# Patient Record
Sex: Female | Born: 1995 | Race: White | Hispanic: No | Marital: Married | Smoking: Never smoker
Health system: Southern US, Community
[De-identification: ages and names within clinical notes are randomized; demographics above are authoritative.]

## PROBLEM LIST (undated history)

## (undated) ENCOUNTER — Inpatient Hospital Stay (HOSPITAL_COMMUNITY): Payer: Self-pay

## (undated) DIAGNOSIS — R21 Rash and other nonspecific skin eruption: Secondary | ICD-10-CM

## (undated) DIAGNOSIS — Z789 Other specified health status: Secondary | ICD-10-CM

## (undated) DIAGNOSIS — M25519 Pain in unspecified shoulder: Secondary | ICD-10-CM

## (undated) HISTORY — PX: WISDOM TOOTH EXTRACTION: SHX21

## (undated) HISTORY — DX: Rash and other nonspecific skin eruption: R21

## (undated) HISTORY — DX: Pain in unspecified shoulder: M25.519

## (undated) HISTORY — PX: NO PAST SURGERIES: SHX2092

## (undated) HISTORY — PX: OTHER SURGICAL HISTORY: SHX169

---
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
## 2015-03-22 NOTE — L&D Delivery Note (Signed)
Delivery Note At  a viable and healthy female was delivered via  (Presentation:left occiput ; anterior ).  APGAR: 8 ,9 ; weight 3200 grm, 7#1  .   Placenta status: Spontaneous, intact.  Cord: 3V with loose nuchal x 1 reduced on the perineum  Pt had some atony after delivery. This resolved with bimanual massage and IM methergine. Additionally, there was a pumping vessel at the hymenal ring tear  Anesthesia:  None, 1% lidocaine with fentanyl for repair Episiotomy:   None Lacerations:  hymenal ring tear Suture Repair: 3.0 vicryl Est. Blood Loss (mL):  500cc  Mom to postpartum.  Baby to Couplet care / Skin to Skin.  Madeline Martin. 10/12/2015, 3:03 AM

## 2015-09-23 LAB — OB RESULTS CONSOLE GBS: GBS: NEGATIVE

## 2015-10-11 ENCOUNTER — Encounter (HOSPITAL_COMMUNITY): Payer: Self-pay | Admitting: *Deleted

## 2015-10-11 ENCOUNTER — Inpatient Hospital Stay (HOSPITAL_COMMUNITY)
Admission: AD | Admit: 2015-10-11 | Discharge: 2015-10-13 | DRG: 775 | Disposition: A | Discharge status: Home / Self Care | Payer: BLUE CROSS/BLUE SHIELD | Source: Ambulatory Visit | Attending: Obstetrics and Gynecology | Admitting: Obstetrics and Gynecology

## 2015-10-11 DIAGNOSIS — IMO0001 Reserved for inherently not codable concepts without codable children: Secondary | ICD-10-CM

## 2015-10-11 DIAGNOSIS — Z9889 Other specified postprocedural states: Secondary | ICD-10-CM

## 2015-10-11 DIAGNOSIS — Z3A38 38 weeks gestation of pregnancy: Secondary | ICD-10-CM | POA: Diagnosis not present

## 2015-10-11 HISTORY — DX: Other specified health status: Z78.9

## 2015-10-11 LAB — CBC
HEMATOCRIT: 38.7 % (ref 36.0–46.0)
Hemoglobin: 13.7 g/dL (ref 12.0–15.0)
MCH: 30.6 pg (ref 26.0–34.0)
MCHC: 35.4 g/dL (ref 30.0–36.0)
MCV: 86.4 fL (ref 78.0–100.0)
PLATELETS: 211 10*3/uL (ref 150–400)
RBC: 4.48 MIL/uL (ref 3.87–5.11)
RDW: 12.7 % (ref 11.5–15.5)
WBC: 13.6 10*3/uL — ABNORMAL HIGH (ref 4.0–10.5)

## 2015-10-11 LAB — TYPE AND SCREEN
ABO/RH(D): A POS
ANTIBODY SCREEN: NEGATIVE

## 2015-10-11 LAB — ABO/RH: ABO/RH(D): A POS

## 2015-10-11 LAB — POCT FERN TEST: POCT Fern Test: POSITIVE

## 2015-10-11 MED ORDER — OXYCODONE-ACETAMINOPHEN 5-325 MG PO TABS: 2.0000 | ORAL_TABLET | ORAL | Status: DC | PRN

## 2015-10-11 MED ORDER — OXYTOCIN 40 UNITS IN LACTATED RINGERS INFUSION - SIMPLE MED
1.0000 m[IU]/min | INTRAVENOUS | Status: DC
  Administered 2015-10-11: 2 m[IU]/min via INTRAVENOUS

## 2015-10-11 MED ORDER — LIDOCAINE HCL (PF) 1 % IJ SOLN
30.0000 mL | INTRAMUSCULAR | Status: DC | PRN
  Administered 2015-10-12: 30 mL via SUBCUTANEOUS
  Filled 2015-10-11: qty 30

## 2015-10-11 MED ORDER — FLEET ENEMA 7-19 GM/118ML RE ENEM: 1.0000 | ENEMA | RECTAL | Status: DC | PRN

## 2015-10-11 MED ORDER — BUTORPHANOL TARTRATE 1 MG/ML IJ SOLN
1.0000 mg | INTRAMUSCULAR | Status: DC | PRN
  Administered 2015-10-12: 1 mg via INTRAVENOUS
  Filled 2015-10-11: qty 1

## 2015-10-11 MED ORDER — OXYTOCIN 40 UNITS IN LACTATED RINGERS INFUSION - SIMPLE MED
2.5000 [IU]/h | INTRAVENOUS | Status: DC
  Filled 2015-10-11: qty 1000

## 2015-10-11 MED ORDER — SOD CITRATE-CITRIC ACID 500-334 MG/5ML PO SOLN: 30.0000 mL | ORAL | Status: DC | PRN

## 2015-10-11 MED ORDER — TERBUTALINE SULFATE 1 MG/ML IJ SOLN
0.2500 mg | Freq: Once | INTRAMUSCULAR | Status: DC | PRN
  Filled 2015-10-11: qty 1

## 2015-10-11 MED ORDER — LACTATED RINGERS IV SOLN: 500.0000 mL | INTRAVENOUS | Status: DC | PRN

## 2015-10-11 MED ORDER — ACETAMINOPHEN 325 MG PO TABS: 650.0000 mg | ORAL_TABLET | ORAL | Status: DC | PRN

## 2015-10-11 MED ORDER — OXYCODONE-ACETAMINOPHEN 5-325 MG PO TABS: 1.0000 | ORAL_TABLET | ORAL | Status: DC | PRN

## 2015-10-11 MED ORDER — LACTATED RINGERS IV SOLN
INTRAVENOUS | Status: DC
  Administered 2015-10-11 (×2): via INTRAVENOUS

## 2015-10-11 MED ORDER — OXYTOCIN BOLUS FROM INFUSION: 500.0000 mL | Freq: Once | INTRAVENOUS | Status: DC

## 2015-10-11 MED ORDER — ONDANSETRON HCL 4 MG/2ML IJ SOLN: 4.0000 mg | Freq: Four times a day (QID) | INTRAMUSCULAR | Status: DC | PRN

## 2015-10-11 NOTE — H&P (Signed)
Madeline Martin is a 20 y.o. female presenting for leaking fluid  20 yo G1P0 @ 38+5 presented for leaking fluid and was confirmed to have broken her water. OB History    Gravida Para Term Preterm AB Living   1             SAB TAB Ectopic Multiple Live Births                 Past Medical History:  Diagnosis Date  . Medical history non-contributory    Past Surgical History:  Procedure Laterality Date  . NO PAST SURGERIES    . WISDOM TOOTH EXTRACTION     Family History: family history is not on file. Social History:  reports that she has never smoked. She has never used smokeless tobacco. She reports that she does not drink alcohol or use drugs.     Maternal Diabetes: No Genetic Screening: Normal Maternal Ultrasounds/Referrals: Normal Fetal Ultrasounds or other Referrals:  None Maternal Substance Abuse:  No Significant Maternal Medications:  None Significant Maternal Lab Results:  None Other Comments:  None  ROS History Dilation: 2.5 Effacement (%): 90 Station: -1 Exam by:: Foye Clock RN Blood pressure 121/69, pulse 97, temperature 97.8 F (36.6 C), temperature source Oral, resp. rate 18, height 5\' 5"  (1.651 m), weight 104.3 kg (230 lb), last menstrual period 01/13/2015. Exam Physical Exam  Prenatal labs: ABO, Rh: --/--/A POS (07/23 1220) Antibody: NEG (07/23 1022) Rubella:  Immune RPR:   NR HBsAg:   Neg HIV:   NR GBS: Negative (07/05 0000)   Assessment/Plan: 1) Admit 2) Epidural on request 3) Pitocin augmentation as needed   Madeline Martin H. 10/11/2015, 5:05 PM

## 2015-10-11 NOTE — MAU Note (Signed)
Pt states she woke up this morning at 0600 and felt some fluid come out.  She then got up and has been wet multiple times.

## 2015-10-11 NOTE — MAU Provider Note (Signed)
Madeline Martin 20 y.o. [redacted]w[redacted]d Gives history of repeated leaking of small amounts of fluid. Sterile speculum exam done. Cervical mucous noted.  Small amount of fluid is pooling vs usual vaginal discharge. Fern slide made.  RN to read. Cervix 2 cm, 90%, feel collection of fluid in front of baby's head.

## 2015-10-11 NOTE — Anesthesia Pain Management Evaluation Note (Signed)
  CRNA Pain Management Visit Note  Patient: Madeline Martin, 20 y.o., female  "Hello I am a member of the anesthesia team at [facility]. We have an anesthesia team available at all times to provide care throughout the hospital, including epidural management and anesthesia for C-section. I don't know your plan for the delivery whether it a natural birth, water birth, IV sedation, nitrous supplementation, doula or epidural, but we want to meet your pain goals."   1.Was your pain managed to your expectations on prior hospitalizations?   No prior hospitalizations  2.What is your expectation for pain management during this hospitalization?     Nitrous Oxide  3.How can we help you reach that goal? unsure  Record the patient's initial score and the patient's pain goal.   Pain: 2  Pain Goal: 9 [facility] wants you to be able to say your pain was always managed very well.  Cephus Shelling 10/11/2015

## 2015-10-12 ENCOUNTER — Encounter (HOSPITAL_COMMUNITY): Payer: Self-pay | Admitting: *Deleted

## 2015-10-12 LAB — CBC
HEMATOCRIT: 34.3 % — AB (ref 36.0–46.0)
HEMOGLOBIN: 12.1 g/dL (ref 12.0–15.0)
MCH: 30.3 pg (ref 26.0–34.0)
MCHC: 35.3 g/dL (ref 30.0–36.0)
MCV: 85.8 fL (ref 78.0–100.0)
Platelets: 193 10*3/uL (ref 150–400)
RBC: 4 MIL/uL (ref 3.87–5.11)
RDW: 12.7 % (ref 11.5–15.5)
WBC: 19.4 10*3/uL — AB (ref 4.0–10.5)

## 2015-10-12 LAB — RPR: RPR: NONREACTIVE

## 2015-10-12 MED ORDER — FENTANYL 2.5 MCG/ML BUPIVACAINE 1/10 % EPIDURAL INFUSION (WH - ANES): 14.0000 mL/h | INTRAMUSCULAR | Status: DC | PRN

## 2015-10-12 MED ORDER — METHYLERGONOVINE MALEATE 0.2 MG/ML IJ SOLN
0.2000 mg | Freq: Once | INTRAMUSCULAR | Status: AC
Start: 2015-10-12 — End: 2015-10-12
  Administered 2015-10-12: 0.2 mg via INTRAMUSCULAR

## 2015-10-12 MED ORDER — SIMETHICONE 80 MG PO CHEW: 80.0000 mg | CHEWABLE_TABLET | ORAL | Status: DC | PRN

## 2015-10-12 MED ORDER — PHENYLEPHRINE 40 MCG/ML (10ML) SYRINGE FOR IV PUSH (FOR BLOOD PRESSURE SUPPORT)
80.0000 ug | PREFILLED_SYRINGE | INTRAVENOUS | Status: DC | PRN
  Filled 2015-10-12: qty 5

## 2015-10-12 MED ORDER — EPHEDRINE 5 MG/ML INJ
10.0000 mg | INTRAVENOUS | Status: DC | PRN
  Filled 2015-10-12: qty 4

## 2015-10-12 MED ORDER — PRENATAL MULTIVITAMIN CH
1.0000 | ORAL_TABLET | Freq: Every day | ORAL | Status: DC
  Administered 2015-10-12 – 2015-10-13 (×2): 1 via ORAL
  Filled 2015-10-12 (×2): qty 1

## 2015-10-12 MED ORDER — DIBUCAINE 1 % RE OINT: 1.0000 "application " | TOPICAL_OINTMENT | RECTAL | Status: DC | PRN

## 2015-10-12 MED ORDER — LACTATED RINGERS IV SOLN: 500.0000 mL | Freq: Once | INTRAVENOUS | Status: DC

## 2015-10-12 MED ORDER — OXYCODONE HCL 5 MG PO TABS: 10.0000 mg | ORAL_TABLET | ORAL | Status: DC | PRN

## 2015-10-12 MED ORDER — METHYLERGONOVINE MALEATE 0.2 MG PO TABS: 0.2000 mg | ORAL_TABLET | ORAL | Status: DC | PRN

## 2015-10-12 MED ORDER — DIPHENHYDRAMINE HCL 50 MG/ML IJ SOLN: 12.5000 mg | INTRAMUSCULAR | Status: DC | PRN

## 2015-10-12 MED ORDER — SENNOSIDES-DOCUSATE SODIUM 8.6-50 MG PO TABS
2.0000 | ORAL_TABLET | ORAL | Status: DC
  Administered 2015-10-13: 2 via ORAL
  Filled 2015-10-12: qty 2

## 2015-10-12 MED ORDER — DIPHENHYDRAMINE HCL 25 MG PO CAPS: 25.0000 mg | ORAL_CAPSULE | Freq: Four times a day (QID) | ORAL | Status: DC | PRN

## 2015-10-12 MED ORDER — TETANUS-DIPHTH-ACELL PERTUSSIS 5-2.5-18.5 LF-MCG/0.5 IM SUSP: 0.5000 mL | Freq: Once | INTRAMUSCULAR | Status: DC

## 2015-10-12 MED ORDER — ZOLPIDEM TARTRATE 5 MG PO TABS: 5.0000 mg | ORAL_TABLET | Freq: Every evening | ORAL | Status: DC | PRN

## 2015-10-12 MED ORDER — NALOXONE HCL 0.4 MG/ML IJ SOLN
INTRAMUSCULAR | Status: AC
  Filled 2015-10-12: qty 1

## 2015-10-12 MED ORDER — FENTANYL CITRATE (PF) 100 MCG/2ML IJ SOLN
INTRAMUSCULAR | Status: AC
  Filled 2015-10-12: qty 2

## 2015-10-12 MED ORDER — COCONUT OIL OIL: 1.0000 "application " | TOPICAL_OIL | Status: DC | PRN

## 2015-10-12 MED ORDER — ACETAMINOPHEN 325 MG PO TABS: 650.0000 mg | ORAL_TABLET | ORAL | Status: DC | PRN

## 2015-10-12 MED ORDER — BENZOCAINE-MENTHOL 20-0.5 % EX AERO
1.0000 "application " | INHALATION_SPRAY | CUTANEOUS | Status: DC | PRN
  Filled 2015-10-12: qty 56

## 2015-10-12 MED ORDER — FENTANYL CITRATE (PF) 100 MCG/2ML IJ SOLN
100.0000 ug | Freq: Once | INTRAMUSCULAR | Status: AC
  Administered 2015-10-12: 100 ug via INTRAVENOUS

## 2015-10-12 MED ORDER — IBUPROFEN 600 MG PO TABS
600.0000 mg | ORAL_TABLET | Freq: Four times a day (QID) | ORAL | Status: DC
  Administered 2015-10-12 – 2015-10-13 (×5): 600 mg via ORAL
  Filled 2015-10-12 (×6): qty 1

## 2015-10-12 MED ORDER — ONDANSETRON HCL 4 MG/2ML IJ SOLN: 4.0000 mg | INTRAMUSCULAR | Status: DC | PRN

## 2015-10-12 MED ORDER — ONDANSETRON HCL 4 MG PO TABS: 4.0000 mg | ORAL_TABLET | ORAL | Status: DC | PRN

## 2015-10-12 MED ORDER — OXYCODONE HCL 5 MG PO TABS: 5.0000 mg | ORAL_TABLET | ORAL | Status: DC | PRN

## 2015-10-12 MED ORDER — METHYLERGONOVINE MALEATE 0.2 MG/ML IJ SOLN: 0.2000 mg | INTRAMUSCULAR | Status: DC | PRN

## 2015-10-12 MED ORDER — WITCH HAZEL-GLYCERIN EX PADS: 1.0000 "application " | MEDICATED_PAD | CUTANEOUS | Status: DC | PRN

## 2015-10-12 NOTE — Lactation Note (Signed)
This note was copied from a baby's chart. Lactation Consultation Note; Initial visit baby now 87 hours old. Baby asleep in visitors arms at present. Mom reports he last fed about 1/2 ago for 10 min on and off the breast. Mom eating lunch and wants to get in shower. Reviewed feeding cues and encouraged to feed whenever she sees them. Reviewed normal newborn behavior the first 24 hours. Mom reports he nursed for 30 min about 4 am and that was his best feeding. Encouraged to call RN or LC to observe latch. BF brochure given with resource sheet. Reviewed OP appointments and BFSG as resources for support after DC. To call for assist when baby feds next.  Patient Name: [name] Today's Date: 10/12/2015 Reason for consult: Initial assessment   Maternal Data Formula Feeding for Exclusion: No Has patient been taught Hand Expression?: Yes Does the patient have breastfeeding experience prior to this delivery?: No  Feeding Feeding Type:  (mother is encouraged to call for Br ass't) Length of feed: 10 min  LATCH Score/Interventions                      Lactation Tools Discussed/Used WIC Program: No   Consult Status Consult Status: Follow-up Date: 10/13/15 Follow-up type: In-patient    Pamelia Hoit 10/12/2015, 2:08 PM

## 2015-10-12 NOTE — Progress Notes (Signed)
Post Partum Day 0 Subjective: no complaints, up ad lib, voiding, tolerating PO, + flatus and breast feeding   Objective: Blood pressure 113/71, pulse 95, temperature 98.3 F (36.8 C), temperature source Oral, resp. rate 18, height 5\' 5"  (1.651 m), weight 104.3 kg (230 lb), last menstrual period 01/13/2015, SpO2 100 %, unknown if currently breastfeeding.  Physical Exam:  General: alert, cooperative and appears stated age Lochia: appropriate Uterine Fundus: firm DVT Evaluation: No evidence of DVT seen on physical exam. Negative Homan's sign. No cords or calf tenderness.   Recent Labs  10/11/15 1022 10/12/15 0519  HGB 13.7 12.1  HCT 38.7 34.3*    Assessment/Plan: Breastfeeding and Circumcision prior to discharge   LOS: 1 day   Madeline Martin Madeline Martin 10/12/2015, 11:06 AM

## 2015-10-13 ENCOUNTER — Ambulatory Visit: Payer: Self-pay

## 2015-10-13 NOTE — Progress Notes (Signed)
PPD#1 Pt has no complaints. Lochia wnl VSSAF IMP/ Doing well Plan/ Will discharge to home.

## 2015-10-13 NOTE — Lactation Note (Signed)
This note was copied from a baby's chart. Lactation Consultation Note  Patient Name: Madeline Martin [id number] Date: 10/13/2015 Reason for consult: Follow-up assessment Baby 40 hours old. Mom reports that baby has been cluster-feeding today. Mom states that baby was sleepy earlier today when he returned from his circumcision, but then woke up and wanted to nurse often. Mom states that he has just received a second dose of Tylenol, and is now sleepy again. Enc lots of STS and nursing with cues.   Mom reports that she will have follow-up appointment with an LC through her peds office this week. Mom aware of OP/BFSG and LC phone line assistance after D/C.   Maternal Data    Feeding Feeding Type: Breast Fed Length of feed: 10 min  LATCH Score/Interventions                      Lactation Tools Discussed/Used     Consult Status Consult Status: Follow-up Date: 10/14/15 Follow-up type: In-patient    Geralynn Ochs 10/13/2015, 6:47 PM

## 2015-10-13 NOTE — Discharge Summary (Signed)
Obstetric Discharge Summary Reason for Admission: rupture of membranes Prenatal Procedures: ultrasound Intrapartum Procedures: spontaneous vaginal delivery Postpartum Procedures: none Complications-Operative and Postpartum: none Hemoglobin  Date Value Ref Range Status  10/12/2015 12.1 12.0 - 15.0 g/dL Final   HCT  Date Value Ref Range Status  10/12/2015 34.3 (L) 36.0 - 46.0 % Final    Physical Exam:  General: alert Lochia: appropriate Uterine Fundus: firm   Discharge Diagnoses: Term Pregnancy-delivered  Discharge Information: Date: 10/13/2015 Activity: pelvic rest Diet: routine Medications: PNV and Ibuprofen Condition: stable Instructions: refer to practice specific booklet Discharge to: home Follow-up Information    Almon Hercules., MD. Schedule an appointment as soon as possible for a visit in 1 month(s).   Specialty:  Obstetrics and Gynecology Contact information: [address] [place] [address] [phone]           Newborn Data: Live born female  Birth Weight: 7 lb 0.9 oz (3200 g) APGAR: 7, 9  Home with mother.  ANDERSON,MARK E 10/13/2015, 9:55 AM

## 2015-10-14 ENCOUNTER — Encounter (HOSPITAL_COMMUNITY): Payer: Self-pay

## 2015-10-14 ENCOUNTER — Encounter: Payer: Self-pay | Admitting: Family Medicine

## 2015-10-14 ENCOUNTER — Ambulatory Visit: Payer: Self-pay

## 2015-10-14 NOTE — Lactation Note (Signed)
This note was copied from a baby's chart. Lactation Consultation Note: Mother states that infant is feeding well. Mother reports that she understands treatment to prevent engorgement. Mother advised to continue to cue base feed infant and at least 8-12 times in 24 hours. Mother has an appt with [facility] tomorrow through [facility] office.  Patient Name: Madeline Martin IDPOE'U Date: 10/14/2015 Reason for consult: Initial assessment;Follow-up assessment   Maternal Data    Feeding    [facility] Score/Interventions                      Lactation Tools Discussed/Used     Consult Status      Madeline Martin 10/14/2015, 10:19 AM

## 2017-09-22 ENCOUNTER — Inpatient Hospital Stay (HOSPITAL_COMMUNITY)
Admission: AD | Admit: 2017-09-22 | Discharge: 2017-09-22 | Disposition: A | Discharge status: Home / Self Care | Payer: 59 | Source: Ambulatory Visit | Attending: Obstetrics and Gynecology | Admitting: Obstetrics and Gynecology

## 2017-09-22 ENCOUNTER — Inpatient Hospital Stay (HOSPITAL_COMMUNITY): Payer: 59

## 2017-09-22 ENCOUNTER — Encounter (HOSPITAL_COMMUNITY): Payer: Self-pay

## 2017-09-22 DIAGNOSIS — Z79899 Other long term (current) drug therapy: Secondary | ICD-10-CM | POA: Insufficient documentation

## 2017-09-22 DIAGNOSIS — O418X10 Other specified disorders of amniotic fluid and membranes, first trimester, not applicable or unspecified: Secondary | ICD-10-CM

## 2017-09-22 DIAGNOSIS — Q513 Bicornate uterus: Secondary | ICD-10-CM | POA: Insufficient documentation

## 2017-09-22 DIAGNOSIS — O468X1 Other antepartum hemorrhage, first trimester: Secondary | ICD-10-CM

## 2017-09-22 DIAGNOSIS — Z3A01 Less than 8 weeks gestation of pregnancy: Secondary | ICD-10-CM | POA: Insufficient documentation

## 2017-09-22 DIAGNOSIS — O26891 Other specified pregnancy related conditions, first trimester: Secondary | ICD-10-CM | POA: Diagnosis not present

## 2017-09-22 DIAGNOSIS — R109 Unspecified abdominal pain: Secondary | ICD-10-CM | POA: Insufficient documentation

## 2017-09-22 DIAGNOSIS — O208 Other hemorrhage in early pregnancy: Secondary | ICD-10-CM | POA: Diagnosis not present

## 2017-09-22 DIAGNOSIS — O3401 Maternal care for unspecified congenital malformation of uterus, first trimester: Secondary | ICD-10-CM | POA: Diagnosis not present

## 2017-09-22 DIAGNOSIS — Z823 Family history of stroke: Secondary | ICD-10-CM | POA: Diagnosis not present

## 2017-09-22 DIAGNOSIS — O4690 Antepartum hemorrhage, unspecified, unspecified trimester: Secondary | ICD-10-CM

## 2017-09-22 DIAGNOSIS — Z3491 Encounter for supervision of normal pregnancy, unspecified, first trimester: Secondary | ICD-10-CM | POA: Diagnosis not present

## 2017-09-22 DIAGNOSIS — O4691 Antepartum hemorrhage, unspecified, first trimester: Secondary | ICD-10-CM | POA: Diagnosis present

## 2017-09-22 LAB — URINALYSIS, ROUTINE W REFLEX MICROSCOPIC
Bilirubin Urine: NEGATIVE
Glucose, UA: NEGATIVE mg/dL
KETONES UR: NEGATIVE mg/dL
Leukocytes, UA: NEGATIVE
Nitrite: NEGATIVE
PROTEIN: 30 mg/dL — AB
Specific Gravity, Urine: 1.03 (ref 1.005–1.030)
pH: 6 (ref 5.0–8.0)

## 2017-09-22 LAB — WET PREP, GENITAL
SPERM: NONE SEEN
Trich, Wet Prep: NONE SEEN
Yeast Wet Prep HPF POC: NONE SEEN

## 2017-09-22 LAB — CBC
HCT: 39 % (ref 36.0–46.0)
HEMOGLOBIN: 13.7 g/dL (ref 12.0–15.0)
MCH: 30.6 pg (ref 26.0–34.0)
MCHC: 35.1 g/dL (ref 30.0–36.0)
MCV: 87.2 fL (ref 78.0–100.0)
Platelets: 207 10*3/uL (ref 150–400)
RBC: 4.47 MIL/uL (ref 3.87–5.11)
RDW: 12.4 % (ref 11.5–15.5)
WBC: 8.4 10*3/uL (ref 4.0–10.5)

## 2017-09-22 LAB — POCT PREGNANCY, URINE: PREG TEST UR: POSITIVE — AB

## 2017-09-22 LAB — HCG, QUANTITATIVE, PREGNANCY: HCG, BETA CHAIN, QUANT, S: 38778 m[IU]/mL — AB (ref <5)

## 2017-09-22 NOTE — Discharge Instructions (Signed)
Subchorionic Hematoma °A subchorionic hematoma is a gathering of blood between the outer wall of the placenta and the inner wall of the womb (uterus). The placenta is the organ that connects the fetus to the wall of the uterus. The placenta performs the feeding, breathing (oxygen to the fetus), and waste removal (excretory work) of the fetus. °Subchorionic hematoma is the most common abnormality found on a result from ultrasonography done during the first trimester or early second trimester of pregnancy. If there has been little or no vaginal bleeding, early small hematomas usually shrink on their own and do not affect your baby or pregnancy. The blood is gradually absorbed over 1-2 weeks. When bleeding starts later in pregnancy or the hematoma is larger or occurs in an older pregnant woman, the outcome may not be as good. Larger hematomas may get bigger, which increases the chances for miscarriage. Subchorionic hematoma also increases the risk of premature detachment of the placenta from the uterus, preterm (premature) labor, and stillbirth. °Follow these instructions at home: °· Stay on bed rest if your health care provider recommends this. Although bed rest will not prevent more bleeding or prevent a miscarriage, your health care provider may recommend bed rest until you are advised otherwise. °· Avoid heavy lifting (more than 10 lb [4.5 kg]), exercise, sexual intercourse, or douching as directed by your health care provider. °· Keep track of the number of pads you use each day and how soaked (saturated) they are. Write down this information. °· Do not use tampons. °· Keep all follow-up appointments as directed by your health care provider. Your health care provider may ask you to have follow-up blood tests or ultrasound tests or both. °Get help right away if: °· You have severe cramps in your stomach, back, abdomen, or pelvis. °· You have a fever. °· You pass large clots or tissue. Save any tissue for your  health care provider to look at. °· Your bleeding increases or you become lightheaded, feel weak, or have fainting episodes. °This information is not intended to replace advice given to you by your health care provider. Make sure you discuss any questions you have with your health care provider. °Document Released: 06/22/2006 Document Revised: 08/13/2015 Document Reviewed: 10/04/2012 °Elsevier Interactive Patient Education © 2017 Elsevier Inc. ° °

## 2017-09-22 NOTE — MAU Note (Addendum)
Pt states she is [redacted] weeks pregnant and states she woke up an hour ago and had a lot of vaginal bleeding. Denies passing clots or tissue. Pt denies pain. LMP: 08/04/2017. [facility]

## 2017-09-22 NOTE — MAU Provider Note (Addendum)
History     CSN: [id number]  Arrival date and time: 09/22/17 0545  Chief Complaint  Patient presents with  . Vaginal Bleeding   G2P1001 @[redacted]w[redacted]d  by sure LMP here with VB. She saw a large amt of brb when she got up around 0430 today. Bleeding since has been minimal. Endorses mild low abdominal cramping over the last week. Has not taken anything for it. Last IC was yesterday am. Has not started [facility] yet.   OB History    Gravida  2   Para  1   Term  1   Preterm  0   AB  0   Living  1     SAB  0   TAB  0   Ectopic  0   Multiple      Live Births  1           Past Medical History:  Diagnosis Date  . Rash and other nonspecific skin eruption   . Shoulder pain     Past Surgical History:  Procedure Laterality Date  . WISDOM TOOTH EXTRACTION      Family History  Problem Relation Age of Onset  . Hypertension Maternal Grandmother   . Hyperlipidemia Maternal Grandmother   . Thyroid disease Maternal Grandmother   . Heart disease Maternal Grandfather   . CVA Maternal Grandfather   . Diabetes Maternal Grandfather   . Heart disease Paternal Grandmother     Social History   Tobacco Use  . Smoking status: Never Smoker  . Smokeless tobacco: Never Used  Substance Use Topics  . Alcohol use: No  . Drug use: No    Allergies: No Known Allergies  Medications Prior to Admission  Medication Sig Dispense Refill Last Dose  . Prenatal Vit-Fe Fumarate-FA (MULTIVITAMIN-PRENATAL) 27-0.8 MG TABS tablet Take 1 tablet by mouth daily at 12 noon.   09/21/2017 at Unknown time  . norethindrone-ethinyl estradiol (JUNEL FE,GILDESS FE,LOESTRIN FE) 1-20 MG-MCG tablet Take 1 tablet by mouth daily. 1 Package 12 Taking    Review of Systems  Gastrointestinal: Positive for abdominal pain.  Genitourinary: Positive for vaginal bleeding.   Physical Exam   Blood pressure (!) 100/33, pulse 66, temperature 98.1 F (36.7 C), temperature source Oral, resp. rate 16, height 5\' 5"  (1.651 m),  weight 179 lb (81.2 kg), last menstrual period 08/04/2017, SpO2 100 %, unknown if currently breastfeeding.  Physical Exam  Nursing note and vitals reviewed. Constitutional: She is oriented to person, place, and time. She appears well-developed and well-nourished. No distress.  HENT:  Head: Normocephalic and atraumatic.  Neck: Normal range of motion.  Respiratory: Effort normal. No respiratory distress.  GI: Soft. She exhibits no distension and no mass. There is no tenderness. There is no rebound and no guarding.  Genitourinary:  Genitourinary Comments: External: no lesions or erythema Vagina: rugated, pink, moist, scant bloody discharge Uterus: + enlarged, anteverted, non tender, no CMT Adnexae: no masses, no tenderness left, no tenderness right Cervix closed, nml   Musculoskeletal: Normal range of motion.  Neurological: She is alert and oriented to person, place, and time.  Skin: Skin is warm and dry.  Psychiatric: She has a normal mood and affect.   Results for orders placed or performed during the hospital encounter of 09/22/17 (from the past 24 hour(s))  Urinalysis, Routine w reflex microscopic     Status: Abnormal   Collection Time: 09/22/17  6:11 AM  Result Value Ref Range   Color, Urine AMBER (A) YELLOW  APPearance HAZY (A) CLEAR   Specific Gravity, Urine 1.030 1.005 - 1.030   pH 6.0 5.0 - 8.0   Glucose, UA NEGATIVE NEGATIVE mg/dL   Hgb urine dipstick LARGE (A) NEGATIVE   Bilirubin Urine NEGATIVE NEGATIVE   Ketones, ur NEGATIVE NEGATIVE mg/dL   Protein, ur 30 (A) NEGATIVE mg/dL   Nitrite NEGATIVE NEGATIVE   Leukocytes, UA NEGATIVE NEGATIVE   RBC / HPF 0-5 0 - 5 RBC/hpf   WBC, UA 0-5 0 - 5 WBC/hpf   Bacteria, UA RARE (A) NONE SEEN   Squamous Epithelial / LPF 6-10 0 - 5   Mucus PRESENT   Wet prep, genital     Status: Abnormal   Collection Time: 09/22/17  6:29 AM  Result Value Ref Range   Yeast Wet Prep HPF POC NONE SEEN NONE SEEN   Trich, Wet Prep NONE SEEN NONE  SEEN   Clue Cells Wet Prep HPF POC PRESENT (A) NONE SEEN   WBC, Wet Prep HPF POC FEW (A) NONE SEEN   Sperm NONE SEEN   Pregnancy, urine POC     Status: Abnormal   Collection Time: 09/22/17  6:36 AM  Result Value Ref Range   Preg Test, Ur POSITIVE (A) NEGATIVE  hCG, quantitative, pregnancy     Status: Abnormal   Collection Time: 09/22/17  6:45 AM  Result Value Ref Range   hCG, Beta Chain, Quant, S 38,778 (H) <5 mIU/mL  CBC     Status: None   Collection Time: 09/22/17  6:45 AM  Result Value Ref Range   WBC 8.4 4.0 - 10.5 K/uL   RBC 4.47 3.87 - 5.11 MIL/uL   Hemoglobin 13.7 12.0 - 15.0 g/dL   HCT 14.7 82.9 - 56.2 %   MCV 87.2 78.0 - 100.0 fL   MCH 30.6 26.0 - 34.0 pg   MCHC 35.1 30.0 - 36.0 g/dL   RDW 13.0 86.5 - 78.4 %   Platelets 207 150 - 400 K/uL   US Ob Less Than 14 Weeks With Ob Transvaginal  Result Date: 09/22/2017 CLINICAL DATA:  Vaginal bleeding in a pregnant patient. EXAM: OBSTETRIC <14 WK [place] AND TRANSVAGINAL OB US TECHNIQUE: Both transabdominal and transvaginal ultrasound examinations were performed for complete evaluation of the gestation as well as the maternal uterus, adnexal regions, and pelvic cul-de-sac. Transvaginal technique was performed to assess early pregnancy. COMPARISON:  None. 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 In the right horn is a single live IUP. A yolk sac is identified. There is cardiac activity within the fetus with a heart rate of 120 beats per minute. The crown-rump length of the fetus is 7.5 mm. There is apparent decidual reaction with heterogeneous fluid material in the left horn. No live IUP in the left horn identified. MSD:   mm    w     d CRL:  7.5 mm   6 w   4 d                  [place] [facility]: May 14, 2018 Subchorionic hemorrhage: There is a small subchorionic hemorrhage adjacent to the live IUP. Maternal uterus/adnexae: There is a corpus luteum cyst on the right. The left ovary is normal. IMPRESSION: 1. 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 In the right horn is a live IUP with an adjacent small subchorionic hemorrhage. 2. In the left horn of the septated versus bicornuate uterus is prominence of the endometrium suggesting decidual reaction. There is heterogeneous  material consistent with blood products versus a failed pregnancy in the left horn. Electronically Signed   By: Gerome Samavid  Williams III M.D   On: 09/22/2017 07:59   MAU Course  Procedures  MDM Labs and US ordered and reviewed. Viable IUP on US (right horn of bicornuate uterus) with [facility], and possible failed preg vs decidual tissue in left horn- discussed findings with pt and spouse. Stable for discharge home.   Assessment and Plan   1. Normal IUP (intrauterine pregnancy) on prenatal ultrasound, first trimester   2. Vaginal bleeding in pregnancy   3. 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      Donette LarryBhambri, Katurah Karapetian, CNM  09/22/2017 8:22 AM

## 2017-09-25 LAB — GC/CHLAMYDIA PROBE AMP (~~LOC~~) NOT AT ARMC
CHLAMYDIA, DNA PROBE: NEGATIVE
NEISSERIA GONORRHEA: NEGATIVE

## 2017-10-18 LAB — OB RESULTS CONSOLE ANTIBODY SCREEN: Antibody Screen: NEGATIVE

## 2017-10-18 LAB — OB RESULTS CONSOLE ABO/RH: RH Type: POSITIVE

## 2017-10-18 LAB — OB RESULTS CONSOLE GC/CHLAMYDIA
CHLAMYDIA, DNA PROBE: NEGATIVE
GC PROBE AMP, GENITAL: NEGATIVE

## 2017-10-18 LAB — OB RESULTS CONSOLE HIV ANTIBODY (ROUTINE TESTING): HIV: NONREACTIVE

## 2017-10-18 LAB — OB RESULTS CONSOLE HEPATITIS B SURFACE ANTIGEN: Hepatitis B Surface Ag: NEGATIVE

## 2017-10-18 LAB — OB RESULTS CONSOLE RUBELLA ANTIBODY, IGM: Rubella: IMMUNE

## 2017-10-18 LAB — OB RESULTS CONSOLE RPR: RPR: NONREACTIVE

## 2018-03-08 ENCOUNTER — Inpatient Hospital Stay (HOSPITAL_COMMUNITY)
Admission: AD | Admit: 2018-03-08 | Discharge: 2018-03-08 | Disposition: A | Discharge status: Home / Self Care | Payer: 59 | Attending: Obstetrics and Gynecology | Admitting: Obstetrics and Gynecology

## 2018-03-08 ENCOUNTER — Encounter (HOSPITAL_COMMUNITY): Payer: Self-pay | Admitting: Emergency Medicine

## 2018-03-08 DIAGNOSIS — Z3689 Encounter for other specified antenatal screening: Secondary | ICD-10-CM

## 2018-03-08 DIAGNOSIS — O368130 Decreased fetal movements, third trimester, not applicable or unspecified: Secondary | ICD-10-CM | POA: Diagnosis present

## 2018-03-08 DIAGNOSIS — Z3A30 30 weeks gestation of pregnancy: Secondary | ICD-10-CM

## 2018-03-08 DIAGNOSIS — Z3A31 31 weeks gestation of pregnancy: Secondary | ICD-10-CM | POA: Insufficient documentation

## 2018-03-08 NOTE — Discharge Instructions (Signed)

## 2018-03-08 NOTE — MAU Note (Signed)
Hasn't felt baby move in the past hour-less movement than usual prior to that.  Tried eating ice cream, lying down and drinking water-didn't help.  No LOF/VB.  No ctx.

## 2018-03-08 NOTE — MAU Provider Note (Signed)
Chief Complaint:  Decreased Fetal Movement   First Provider Initiated Contact with Patient 03/08/18 2222    [facility]    HPI: Youlanda MightyMadison Jemmott is a 22 y.o. G2P1001 at 6930w6dwho presents to maternity admissions reporting decreased fetal movement since an hour ago.  Now in the room she does feel movement.  No pain or bleeding.  She reports good fetal movement now, denies LOF, vaginal bleeding, vaginal itching/burning, urinary symptoms, h/a, dizziness, n/v, diarrhea, constipation or fever/chills.  She denies headache, visual changes or RUQ abdominal pain.  RN Note: Hasn't felt baby move in the past hour-less movement than usual prior to that.  Tried eating ice cream, lying down and drinking water-didn't help.  No LOF/VB.  No ctx.    Past Medical History: Past Medical History:  Diagnosis Date  . Rash and other nonspecific skin eruption   . 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 WISDOM TOOTH EXTRACTION      Family History: Family History  Problem Relation Age of Onset  . Hypertension Maternal Grandmother   . Hyperlipidemia Maternal Grandmother   . Thyroid disease Maternal Grandmother   . Heart disease Maternal Grandfather   . CVA Maternal Grandfather   . Diabetes Maternal Grandfather   . Heart disease Paternal Grandmother     Social History: Social History   Tobacco Use  . Smoking status: Never Smoker  . Smokeless tobacco: Never Used  Substance Use Topics  . Alcohol use: No  . Drug use: No    Allergies: No Known Allergies  Meds:  Medications Prior to Admission  Medication Sig  Dispense Refill Last Dose  . Prenatal Vit-Fe Fumarate-FA (MULTIVITAMIN-PRENATAL) 27-0.8 MG TABS tablet Take 1 tablet by mouth daily at 12 noon.   03/08/2018 at Unknown time    I have reviewed patient's Past Medical Hx, Surgical Hx, Family Hx, Social Hx, medications and allergies.   ROS:  Review of Systems  Constitutional: Negative for chills and fever.  Respiratory: Negative for shortness of breath.   Gastrointestinal: Negative for abdominal pain, constipation and diarrhea.  Genitourinary: Negative for pelvic pain, vaginal bleeding and vaginal discharge.   Other systems negative  Physical Exam   Patient Vitals for the past 24 hrs:  BP Temp Temp src Pulse Resp SpO2 Height Weight  03/08/18 2156 (!) 112/52 98.4 F (36.9 C) Oral 74 17 100 % 5\' 5"  (1.651 m) 97.3 kg   Constitutional: Well-developed, well-nourished female in no acute distress.  Cardiovascular: normal rate and rhythm Respiratory: normal effort, clear to auscultation bilaterally GI: Abd soft, non-tender, gravid appropriate for gestational age.   No rebound or guarding. MS: Extremities nontender, no edema, normal ROM Neurologic: Alert and oriented x 4.  GU: Neg CVAT.  PELVIC EXAM: deferred  FHT:  Baseline 135 , moderate variability, accelerations present, no decelerations Contractions:   Rare   Labs: No results found for this or any previous visit (from the  past 24 hour(s)).    Imaging:  No results found.  MAU Course/MDM: NST reviewed and is quite reactive. Fetal movement is audible.  Variability is good, as is reactivity. No decels  Treatments in MAU included EFM.    Assessment: Single intrauterine pregnancy at 2262w0d Decreased fetal movement, now moving well Category I fetal heart rate tracing  Plan: Discharge home Preterm Labor precautions and fetal kick counts Follow up in Office for prenatal visits and recheck of status  Encouraged to return here or to other Urgent Care/ED if she develops worsening  of symptoms, increase in pain, fever, or other concerning symptoms.   Pt stable at time of discharge.  Wynelle BourgeoisMarie Harmonie Verrastro CNM, MSN Certified Nurse-Midwife 03/08/2018 10:22 PM

## 2018-03-21 NOTE — L&D Delivery Note (Signed)
Delivery Note I was in house and was called to help with delivery for CNM Fredric Mare who was en route. Patient just arrived from MAU in active labor in 2nd stage.  When I arrived in the room, she was complete and +3.  2 Pushes and at 12:17 AM a viable and healthy female was delivered via Vaginal, Spontaneous (Presentation: OA ).  APGAR: 9, 9; weight  -pending.   Placenta status: spontaneous and complete.  Cord:  with the following complications: none.  Cord pH: N/A  Anesthesia:  Local 1% lidocaine for repair Episiotomy: None Lacerations: right inner labial laceration Suture Repair: 3.0 vicryl rapide Est. Blood Loss (mL): 117  Mom to postpartum.  Baby to Couplet care / Skin to Skin.  Robley Fries 05/08/2018, 12:42 AM

## 2018-04-12 LAB — OB RESULTS CONSOLE GBS: GBS: NEGATIVE

## 2018-05-07 ENCOUNTER — Telehealth (HOSPITAL_COMMUNITY): Payer: Self-pay | Admitting: *Deleted

## 2018-05-07 ENCOUNTER — Inpatient Hospital Stay (HOSPITAL_COMMUNITY)
Admission: AD | Admit: 2018-05-07 | Discharge: 2018-05-09 | DRG: 807 | Disposition: A | Discharge status: Home / Self Care | Payer: 59 | Attending: Obstetrics & Gynecology | Admitting: Obstetrics & Gynecology

## 2018-05-07 ENCOUNTER — Encounter (HOSPITAL_COMMUNITY): Payer: Self-pay

## 2018-05-07 ENCOUNTER — Encounter (HOSPITAL_COMMUNITY): Payer: Self-pay | Admitting: *Deleted

## 2018-05-07 DIAGNOSIS — Z3483 Encounter for supervision of other normal pregnancy, third trimester: Secondary | ICD-10-CM | POA: Diagnosis present

## 2018-05-07 DIAGNOSIS — Z3490 Encounter for supervision of normal pregnancy, unspecified, unspecified trimester: Secondary | ICD-10-CM

## 2018-05-07 DIAGNOSIS — Z3A39 39 weeks gestation of pregnancy: Secondary | ICD-10-CM

## 2018-05-07 NOTE — Telephone Encounter (Signed)
Preadmission screen  

## 2018-05-08 ENCOUNTER — Inpatient Hospital Stay (HOSPITAL_COMMUNITY): Admission: RE | Admit: 2018-05-08 | Payer: 59 | Source: Ambulatory Visit

## 2018-05-08 ENCOUNTER — Other Ambulatory Visit: Payer: Self-pay

## 2018-05-08 ENCOUNTER — Encounter (HOSPITAL_COMMUNITY): Payer: Self-pay

## 2018-05-08 DIAGNOSIS — Z3490 Encounter for supervision of normal pregnancy, unspecified, unspecified trimester: Secondary | ICD-10-CM

## 2018-05-08 LAB — CBC
HCT: 38.9 % (ref 36.0–46.0)
Hemoglobin: 13.5 g/dL (ref 12.0–15.0)
MCH: 30.3 pg (ref 26.0–34.0)
MCHC: 34.7 g/dL (ref 30.0–36.0)
MCV: 87.4 fL (ref 80.0–100.0)
Platelets: 219 10*3/uL (ref 150–400)
RBC: 4.45 MIL/uL (ref 3.87–5.11)
RDW: 12.8 % (ref 11.5–15.5)
WBC: 21.1 10*3/uL — ABNORMAL HIGH (ref 4.0–10.5)

## 2018-05-08 MED ORDER — OXYTOCIN 10 UNIT/ML IJ SOLN
INTRAMUSCULAR | Status: AC
  Administered 2018-05-08: 10 [IU] via INTRAMUSCULAR
  Filled 2018-05-08: qty 1

## 2018-05-08 MED ORDER — COCONUT OIL OIL: 1.0000 "application " | TOPICAL_OIL | Status: DC | PRN

## 2018-05-08 MED ORDER — MISOPROSTOL 200 MCG PO TABS
1000.0000 ug | ORAL_TABLET | Freq: Once | ORAL | Status: AC
  Administered 2018-05-08: 1000 ug via RECTAL

## 2018-05-08 MED ORDER — WITCH HAZEL-GLYCERIN EX PADS: 1.0000 "application " | MEDICATED_PAD | CUTANEOUS | Status: DC | PRN

## 2018-05-08 MED ORDER — OXYTOCIN 10 UNIT/ML IJ SOLN
10.0000 [IU] | Freq: Once | INTRAMUSCULAR | Status: AC
  Administered 2018-05-08: 10 [IU] via INTRAMUSCULAR

## 2018-05-08 MED ORDER — IBUPROFEN 600 MG PO TABS
600.0000 mg | ORAL_TABLET | Freq: Four times a day (QID) | ORAL | Status: DC
  Administered 2018-05-08 – 2018-05-09 (×6): 600 mg via ORAL
  Filled 2018-05-08 (×6): qty 1

## 2018-05-08 MED ORDER — MISOPROSTOL 200 MCG PO TABS
ORAL_TABLET | ORAL | Status: AC
  Administered 2018-05-08: 1000 ug via RECTAL
  Filled 2018-05-08: qty 5

## 2018-05-08 MED ORDER — ONDANSETRON HCL 4 MG/2ML IJ SOLN: 4.0000 mg | INTRAMUSCULAR | Status: DC | PRN

## 2018-05-08 MED ORDER — DIPHENHYDRAMINE HCL 25 MG PO CAPS: 25.0000 mg | ORAL_CAPSULE | Freq: Four times a day (QID) | ORAL | Status: DC | PRN

## 2018-05-08 MED ORDER — LIDOCAINE HCL (PF) 1 % IJ SOLN
INTRAMUSCULAR | Status: AC
  Administered 2018-05-08: 30 mL
  Filled 2018-05-08: qty 30

## 2018-05-08 MED ORDER — ZOLPIDEM TARTRATE 5 MG PO TABS: 5.0000 mg | ORAL_TABLET | Freq: Every evening | ORAL | Status: DC | PRN

## 2018-05-08 MED ORDER — DIBUCAINE 1 % RE OINT: 1.0000 "application " | TOPICAL_OINTMENT | RECTAL | Status: DC | PRN

## 2018-05-08 MED ORDER — TETANUS-DIPHTH-ACELL PERTUSSIS 5-2.5-18.5 LF-MCG/0.5 IM SUSP: 0.5000 mL | Freq: Once | INTRAMUSCULAR | Status: DC

## 2018-05-08 MED ORDER — PRENATAL MULTIVITAMIN CH
1.0000 | ORAL_TABLET | Freq: Every day | ORAL | Status: DC
  Administered 2018-05-08 – 2018-05-09 (×2): 1 via ORAL
  Filled 2018-05-08 (×2): qty 1

## 2018-05-08 MED ORDER — SENNOSIDES-DOCUSATE SODIUM 8.6-50 MG PO TABS
2.0000 | ORAL_TABLET | ORAL | Status: DC
  Administered 2018-05-09: 2 via ORAL
  Filled 2018-05-08: qty 2

## 2018-05-08 MED ORDER — SIMETHICONE 80 MG PO CHEW: 80.0000 mg | CHEWABLE_TABLET | ORAL | Status: DC | PRN

## 2018-05-08 MED ORDER — SOD CITRATE-CITRIC ACID 500-334 MG/5ML PO SOLN: 30.0000 mL | ORAL | Status: DC | PRN

## 2018-05-08 MED ORDER — LIDOCAINE HCL (PF) 1 % IJ SOLN
30.0000 mL | INTRAMUSCULAR | Status: DC | PRN
  Filled 2018-05-08: qty 30

## 2018-05-08 MED ORDER — ACETAMINOPHEN 325 MG PO TABS: 650.0000 mg | ORAL_TABLET | ORAL | Status: DC | PRN

## 2018-05-08 MED ORDER — ONDANSETRON HCL 4 MG/2ML IJ SOLN: 4.0000 mg | Freq: Four times a day (QID) | INTRAMUSCULAR | Status: DC | PRN

## 2018-05-08 MED ORDER — ONDANSETRON HCL 4 MG PO TABS: 4.0000 mg | ORAL_TABLET | ORAL | Status: DC | PRN

## 2018-05-08 MED ORDER — BENZOCAINE-MENTHOL 20-0.5 % EX AERO: 1.0000 "application " | INHALATION_SPRAY | CUTANEOUS | Status: DC | PRN

## 2018-05-08 NOTE — Progress Notes (Signed)
  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  Will check with insurance company for hospital coverage, if not then will plan procedure for outpatient.  Feeding: breast  Pain control at delivery: None   S:  Reports feeling well             Tolerating po/ No nausea or vomiting             Bleeding is light             Pain controlled with ibuprofen (OTC)             Up ad lib / ambulatory / voiding without difficulties   O:  A & O x 3, in no apparent distress              VS:  Vitals:   05/08/18 0130 05/08/18 0215 05/08/18 0315 05/08/18 0717  BP: 130/79 108/67 (!) 106/58 102/71  Pulse: 72 67 68 76  Resp:  18 16 20   Temp:  98.3 F (36.8 C) 98.3 F (36.8 C) 98.2 F (36.8 C)  TempSrc:  Oral Oral Oral  SpO2:  97% 98% 97%  Weight:      Height:        LABS:  Recent Labs    05/08/18 0528  WBC 21.1*  HGB 13.5  HCT 38.9  PLT 219    Blood type: A/Positive/-- (07/31 0000)  Rubella: Immune (07/31 0000)   I&O: I/O last 3 completed shifts: In: -  Out: 256 [Blood:256]          No intake/output data recorded.  Vaccines: TDaP wants         Flu    declines   Abdomen: soft, non-tender, non-distended             Fundus: firm, non-tender, U  Perineum: approximated  Lochia: moderate  Extremities: +1 pretibial edema, no calf pain or tenderness    A/P: PPD # 0 23 y.o., G2P1001   Principal Problem:   Postpartum care following vaginal delivery 2/18 Active Problems:   SVD (spontaneous vaginal delivery)   Obstetrical laceration - right inner labial laceration  Ice packs and dermoplast spray to perineum and sitz baths prn.    Doing well - stable status  Routine post partum orders  Tdap prior to discharge  Anticipate  discharge tomorrow    Francine Graven, MSN, CNM 05/08/2018, 9:51 AM

## 2018-05-08 NOTE — Lactation Note (Signed)
This note was copied from a baby's chart. Lactation Consultation Note  Patient Name: Madeline Martin [id number] Date: 05/08/2018 Reason for consult: Initial assessment;Term  Initial visit with P2 mom, baby is 8 hours old now. Mom has a 2.5yo son that she breastfed for 13-14 months without difficulty. Mom states she still has her Medela pump, but has ordered new pieces for this baby. Mom holding baby at this time with multiple visitors in the room, states baby just fed for . Baby wrapped in swaddle blanket but awake and displaying feeding cues. Offered to assist mom with putting baby back to breast and mom declines at this time.  Mom states baby is feeding well and denies any discomfort or pain with latching baby. Reviewed feeding cues and feeding baby 8-12 times in 24 hours or with feeding cues. Reviewed breast massage and hand expression prior to latching. Reviewed various positions for feeding, mom states she has been doing cross cradle and alternating breasts with feeds. Notified mom of IP/OP lactation services, and breastfeeding support group. Mom given extra I & O record, list of available resources, and lactation brochure with phone number. Encouraged mom to call out with any questions/concerns.  Interventions Interventions: Breast feeding basics reviewed;Skin to skin;Breast massage;Hand express;Support pillows;Position options   Consult Status Consult Status: Follow-up Date: 05/09/18 Follow-up type: In-patient    Virgia Land 05/08/2018, 2:08 PM

## 2018-05-08 NOTE — H&P (Signed)
Madeline Martin is a 23 y.o. female presenting for active labor. Cervical foley bulb was placed in office at 1 pm and had some cramping since then but UCs got painful at 9 pm. LOF before arriving to MAU, no VB. +FMs. Foley expelled in MAU bathroom and she was brought back to L&D with intense pelvic pressure.  G2P1001, 1st SVD at term.   PNCare - ffrom 8 wks, dated by LMP= 1st trim sono. Seeing CNM at [facility]. 54 lb wt gain. Hair loss in pregnancy, nl thyroid labs.   OB History    Gravida  2   Para  1   Term  1   Preterm  0   AB  0   Living  1     SAB  0   TAB  0   Ectopic  0   Multiple      Live Births  1          Past Medical History:  Diagnosis Date  . Medical history non-contributory   . Rash and other nonspecific skin eruption   . Shoulder pain    Past Surgical History:  Procedure Laterality Date  . arm surgery    . NO PAST SURGERIES    . WISDOM TOOTH EXTRACTION     Family History: family history includes CVA in her maternal grandfather. Social History:  reports that she has never smoked. She has never used smokeless tobacco. She reports that she does not drink alcohol or use drugs.     Maternal Diabetes: No Genetic Screening: Normal QUAD Maternal Ultrasounds/Referrals: Normal  Fetal Ultrasounds or other Referrals:  None Maternal Substance Abuse:  No Significant Maternal Medications:  None Significant Maternal Lab Results:  Lab values include: Group B Strep negative Other Comments:  None  ROS neg  History Dilation: 10 Station: Plus 3 Exam by:: Ryan RN Blood pressure 117/74, pulse (!) 103, last menstrual period 08/04/2017, unknown if currently breastfeeding. Exam Physical Exam  BP 117/74   Pulse (!) 103   LMP 08/04/2017  Physical exam:  A&O x 3, no acute distress. Pleasant HEENT neg, no thyromegaly Lungs CTA bilat CV RRR, S1S2 normal Abdo soft, non tender, non acute Extr no edema/ tenderness Pelvic complete +2 FHT  Intermittent 130s   Toco q 2 min  Prenatal labs: ABO, Rh: A/Positive/-- (07/31 0000) Antibody: Negative (07/31 0000) Rubella: Immune (07/31 0000) RPR: Nonreactive (07/31 0000)  HBsAg: Negative (07/31 0000)  HIV: Non-reactive (07/31 0000)  GBS: Negative (01/23 0000)  Glucola nl. QUAD nl  Assessment/Plan: Admit, stage II labor, anticipate SVD CNM en route.    Robley Fries 05/08/2018, 12:53 AM

## 2018-05-09 ENCOUNTER — Encounter (HOSPITAL_COMMUNITY): Payer: Self-pay | Admitting: *Deleted

## 2018-05-09 MED ORDER — IBUPROFEN 600 MG PO TABS: 600.0000 mg | ORAL_TABLET | Freq: Four times a day (QID) | ORAL | 0 refills | Status: AC
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
## 2018-05-09 NOTE — Lactation Note (Signed)
This note was copied from a baby's chart. Lactation Consultation Note  Patient Name: Madeline Martin [id number] Date: 05/09/2018 Reason for consult: Follow-up assessment;Term Baby is currently at the breast in cradle hold.  Mom reports that baby is latching well and she is comfortable.  Reviewed milk coming to volume and the prevention and treatment of engorgement.  She has a pump at home.  No questions or concerns.  Lactation outpatient services and support reviewed and encouraged prn.  Maternal Data    Feeding Feeding Type: Breast Fed  LATCH Score                   Interventions    Lactation Tools Discussed/Used     Consult Status Consult Status: Complete Follow-up type: Call as needed    Huston Foley 05/09/2018, 9:55 AM

## 2018-05-09 NOTE — Progress Notes (Signed)
PPD #1, SVD, right labial splay, baby boy "Engineer, production"  S:  Reports feeling good, ready to be discharged home              Tolerating po/ No nausea or vomiting / Denies dizziness or SOB             Bleeding is light             Pain controlled with Motrin              Up ad lib / ambulatory / voiding QS  Newborn breast feeding  / Circumcision - planning prior to discharge   O:               VS: BP 102/64 (BP Location: Left Arm)   Pulse 66   Temp 97.7 F (36.5 C) (Oral)   Resp 18   Ht 5\' 5"  (1.651 m)   Wt 105.2 kg   LMP 08/04/2017   SpO2 100%   BMI 38.61 kg/m    LABS:              Recent Labs    05/08/18 0528  WBC 21.1*  HGB 13.5  PLT 219               Blood type: A/Positive/-- (07/31 0000)  Rubella: Immune (07/31 0000)                    Declined flu and Tdap               Physical Exam:             Alert and oriented X3  Lungs: Clear and unlabored  Heart: regular rate and rhythm / no murmurs  Abdomen: soft, non-tender, non-distended              Fundus: firm, non-tender, U-1  Perineum: intact, no edema, no erythema, no ecchymosis; labial repair intact  Lochia: small, no clots   Extremities: trace pedal edema, no calf pain or tenderness    A: PPD # 1, SVD  Right labial repair   Doing well - stable status  P: Routine post partum orders  Discharge home today after circumcision   Instructions and warning s/s reviewed   F/u with CNM in 6 weeks   Carlean Jews, MSN, CNM [facility]

## 2018-12-05 IMAGING — US US OB < 14 WEEKS - US OB TV
1 series · 15 of 28 positions shown · non-contrast
Comparison: None.

CLINICAL DATA: Vaginal bleeding in a pregnant patient.

EXAM:
OBSTETRIC <14 WK US AND TRANSVAGINAL OB US
TECHNIQUE: Both transabdominal and transvaginal ultrasound examinations were
performed for complete evaluation of the gestation as well as the
maternal uterus, adnexal regions, and pelvic cul-de-sac.
Transvaginal technique was performed to assess early pregnancy.

[Series 1: us ob < 14 weeks - us ob tv · 15 of 92 slices shown]
[im 1/92]
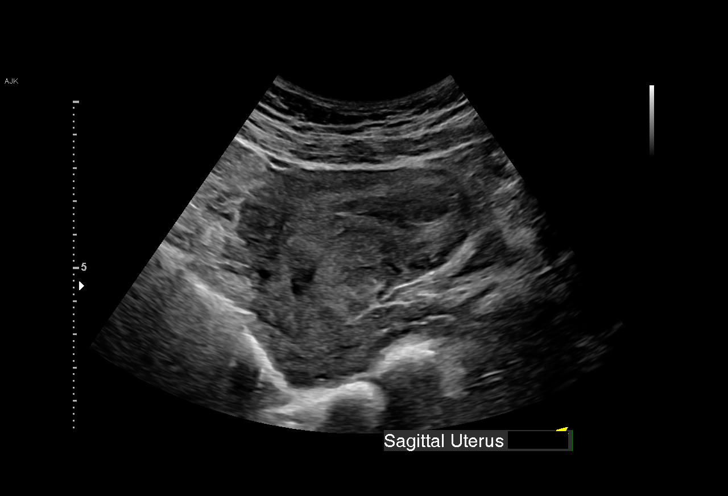
[im 7/92]
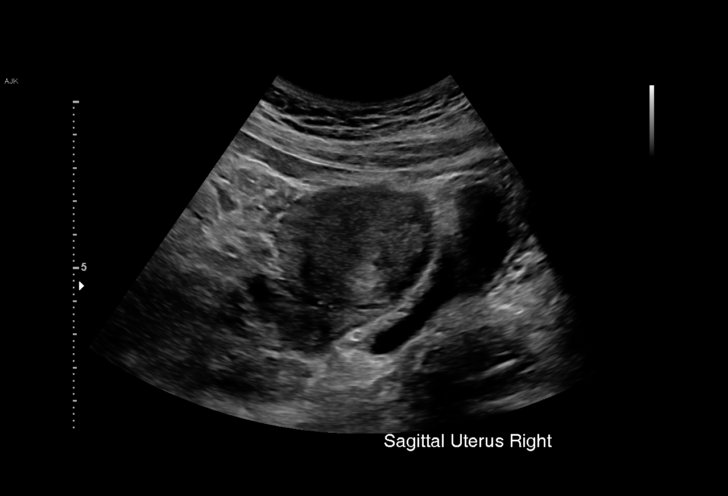
[im 14/92]
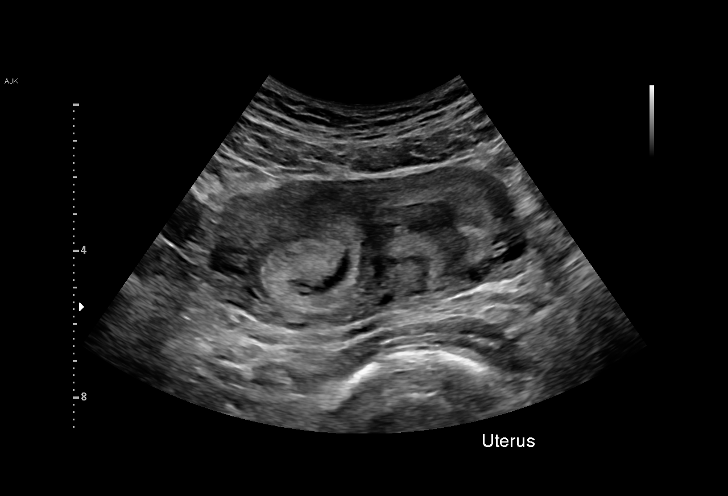
[im 21/92]
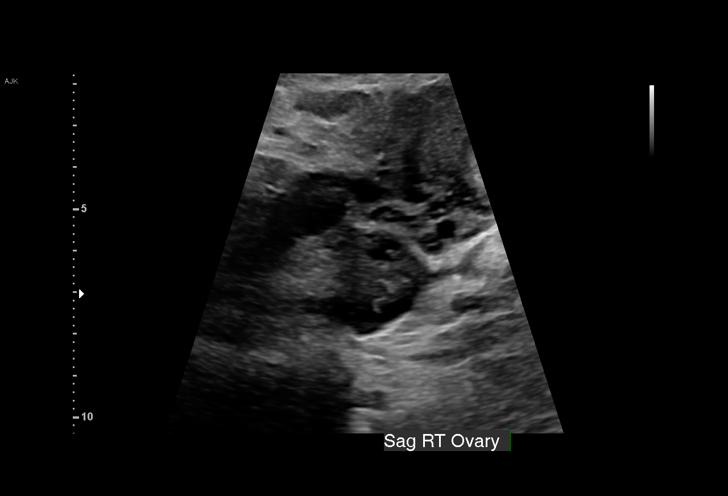
[im 27/92]
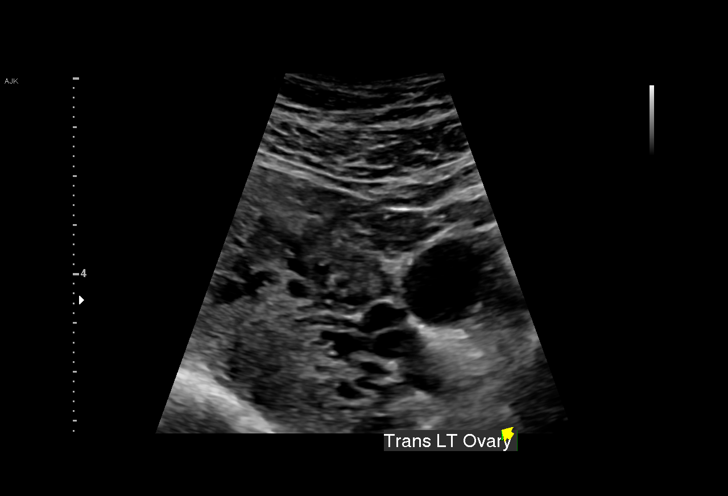
[im 34/92]
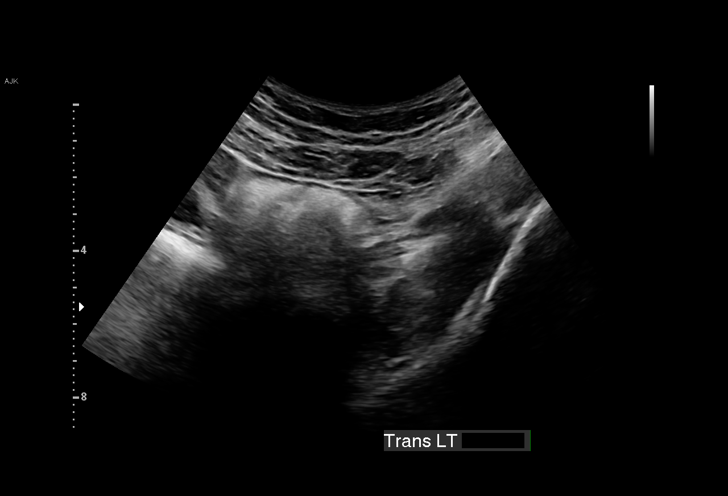
[im 41/92]
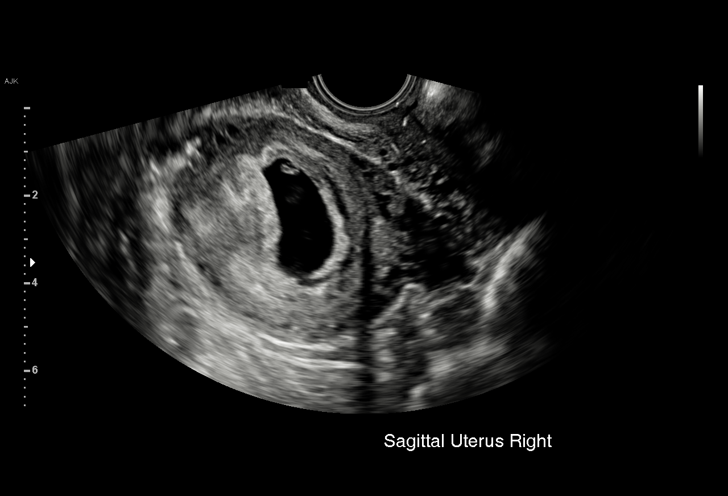
[im 48/92]
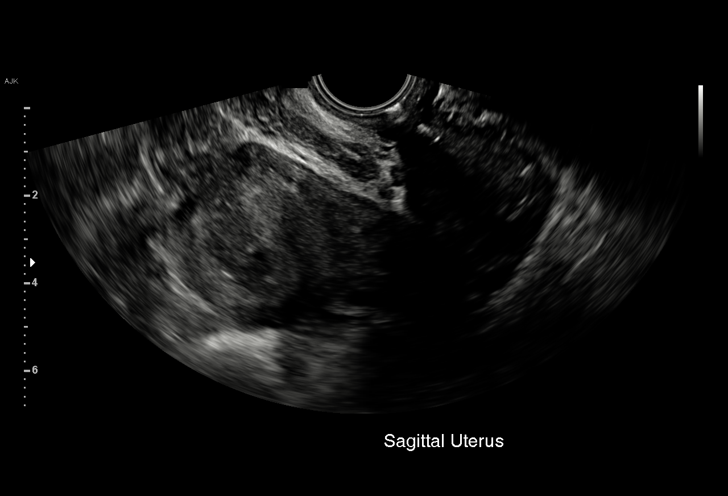
[im 51/92]
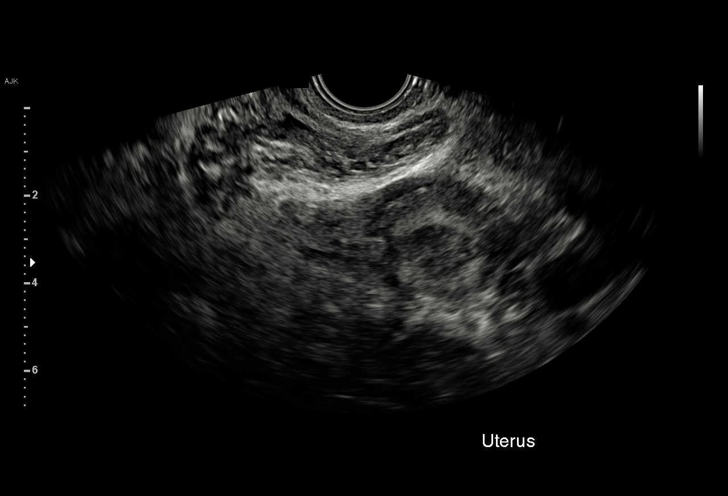
[im 58/92]
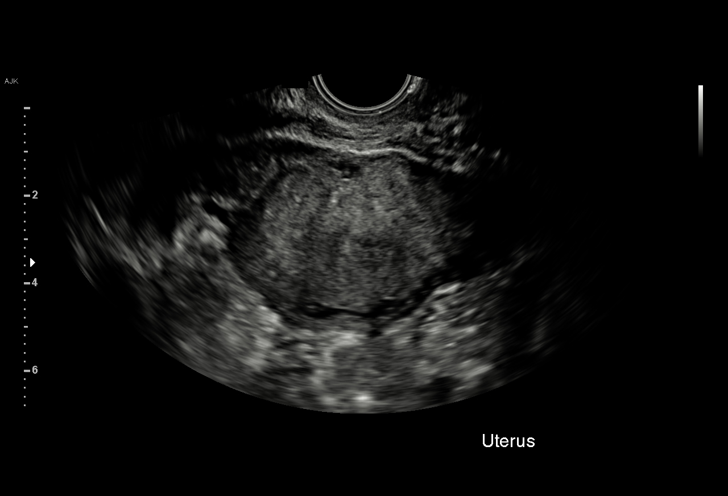
[im 65/92]
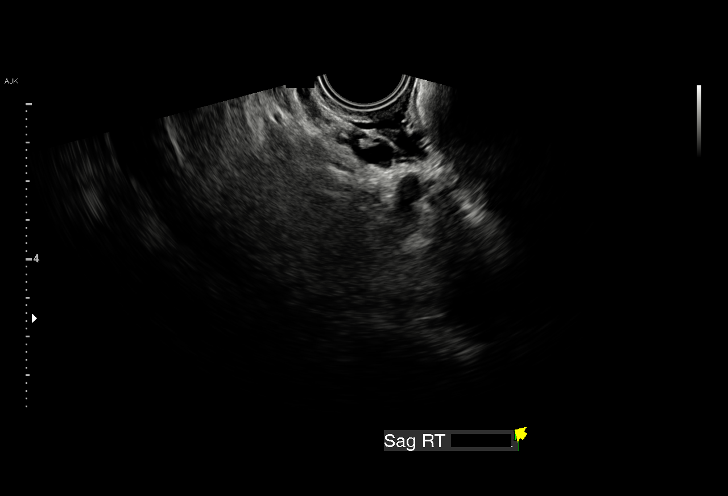
[im 71/92]
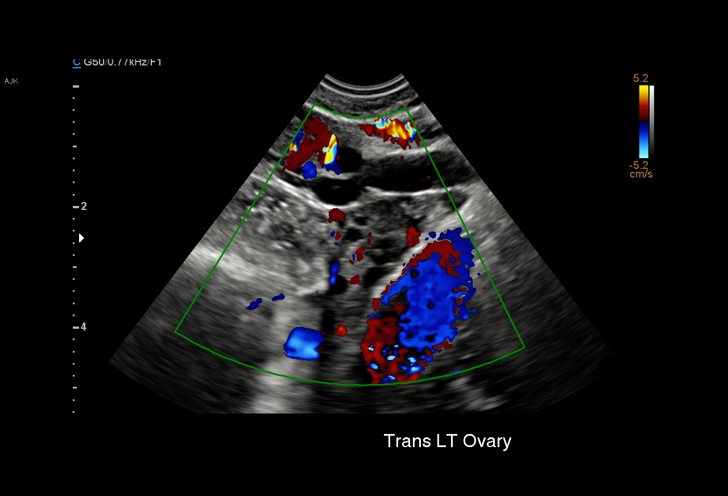
[im 78/92]
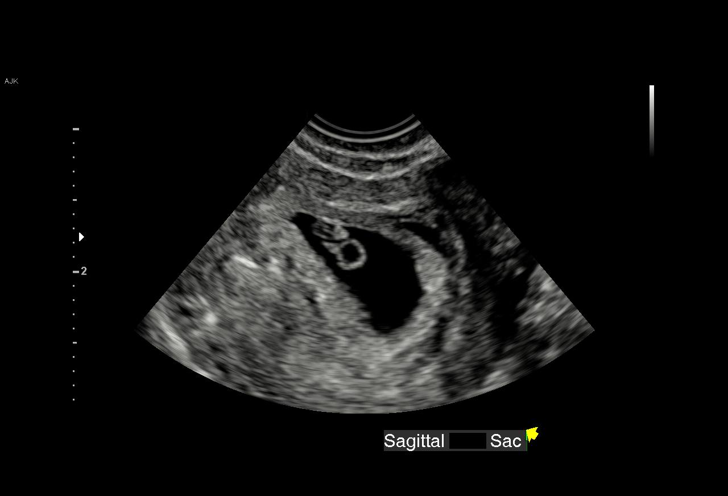
[im 85/92]
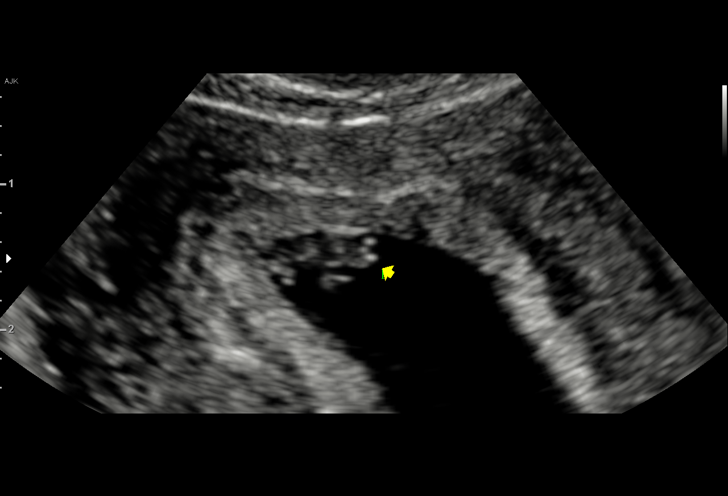
[im 92/92]
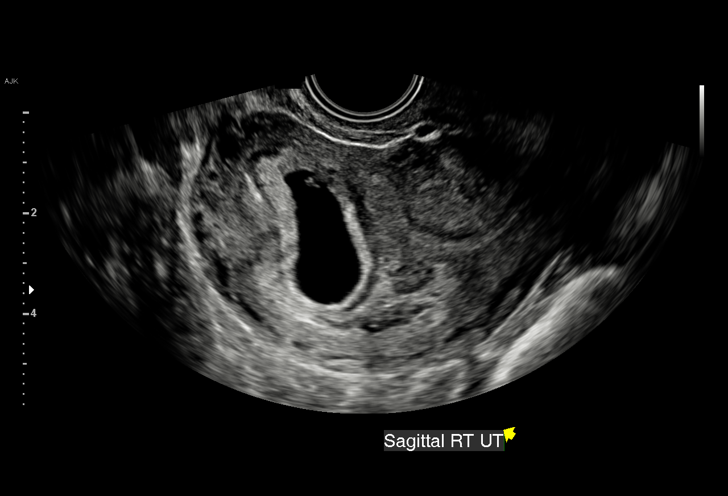

[15 of 28 positions shown; findings below may reference images not displayed]

FINDINGS: There is a septate versus bicornuate uterus.

In the right horn is a single live IUP. A yolk sac is identified.
There is cardiac activity within the fetus with a heart rate of 120
beats per minute. The crown-rump length of the fetus is 7.5 mm.

There is apparent decidual reaction with heterogeneous fluid
material in the left horn. No live IUP in the left horn identified.

MSD:   mm    w     d

CRL:  7.5 mm   6 w   4 d                  US EDC: May 14, 2018

Subchorionic hemorrhage: There is a small subchorionic hemorrhage
adjacent to the live IUP.

Maternal uterus/adnexae: There is a corpus luteum cyst on the right.
The left ovary is normal.
IMPRESSION: 1. There is a septate versus bicornuate uterus. In the right horn is
a live IUP with an adjacent small subchorionic hemorrhage.
2. In the left horn of the septated versus bicornuate uterus is
prominence of the endometrium suggesting decidual reaction. There is
heterogeneous material consistent with blood products versus a
failed pregnancy in the left horn.

## 2020-10-31 ENCOUNTER — Emergency Department (HOSPITAL_BASED_OUTPATIENT_CLINIC_OR_DEPARTMENT_OTHER)
Admission: EM | Admit: 2020-10-31 | Discharge: 2020-10-31 | Disposition: A | Discharge status: Home / Self Care | Payer: 59 | Attending: Emergency Medicine | Admitting: Emergency Medicine

## 2020-10-31 ENCOUNTER — Other Ambulatory Visit: Payer: Self-pay

## 2020-10-31 ENCOUNTER — Encounter (HOSPITAL_BASED_OUTPATIENT_CLINIC_OR_DEPARTMENT_OTHER): Payer: Self-pay

## 2020-10-31 DIAGNOSIS — T754XXA Electrocution, initial encounter: Secondary | ICD-10-CM

## 2020-10-31 DIAGNOSIS — R519 Headache, unspecified: Secondary | ICD-10-CM | POA: Insufficient documentation

## 2020-10-31 DIAGNOSIS — R202 Paresthesia of skin: Secondary | ICD-10-CM | POA: Insufficient documentation

## 2020-10-31 LAB — CBC WITH DIFFERENTIAL/PLATELET
Abs Immature Granulocytes: 0.02 10*3/uL (ref 0.00–0.07)
Basophils Absolute: 0 10*3/uL (ref 0.0–0.1)
Basophils Relative: 0 %
Eosinophils Absolute: 0 10*3/uL (ref 0.0–0.5)
Eosinophils Relative: 0 %
HCT: 40.7 % (ref 36.0–46.0)
Hemoglobin: 14.1 g/dL (ref 12.0–15.0)
Immature Granulocytes: 0 %
Lymphocytes Relative: 27 %
Lymphs Abs: 2.4 10*3/uL (ref 0.7–4.0)
MCH: 29.6 pg (ref 26.0–34.0)
MCHC: 34.6 g/dL (ref 30.0–36.0)
MCV: 85.5 fL (ref 80.0–100.0)
Monocytes Absolute: 0.4 10*3/uL (ref 0.1–1.0)
Monocytes Relative: 5 %
Neutro Abs: 6 10*3/uL (ref 1.7–7.7)
Neutrophils Relative %: 68 %
Platelets: 264 10*3/uL (ref 150–400)
RBC: 4.76 MIL/uL (ref 3.87–5.11)
RDW: 12.1 % (ref 11.5–15.5)
WBC: 8.9 10*3/uL (ref 4.0–10.5)
nRBC: 0 % (ref 0.0–0.2)

## 2020-10-31 LAB — CK: Total CK: 71 U/L (ref 38–234)

## 2020-10-31 LAB — BASIC METABOLIC PANEL
Anion gap: 7 (ref 5–15)
BUN: 15 mg/dL (ref 6–20)
CO2: 24 mmol/L (ref 22–32)
Calcium: 8.9 mg/dL (ref 8.9–10.3)
Chloride: 107 mmol/L (ref 98–111)
Creatinine, Ser: 0.74 mg/dL (ref 0.44–1.00)
GFR, Estimated: >60 mL/min (ref >60)
Glucose, Bld: 101 mg/dL — ABNORMAL HIGH (ref 70–99)
Potassium: 3.6 mmol/L (ref 3.5–5.1)
Sodium: 138 mmol/L (ref 135–145)
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
